# Patient Record
Sex: Male | Born: 1980 | Hispanic: No | Marital: Single | Smoking: Never smoker
Health system: Southern US, Community
[De-identification: ages and names within clinical notes are randomized; demographics above are authoritative.]

---

## 2015-01-20 ENCOUNTER — Encounter (HOSPITAL_BASED_OUTPATIENT_CLINIC_OR_DEPARTMENT_OTHER): Payer: Self-pay

## 2015-01-20 ENCOUNTER — Emergency Department (HOSPITAL_BASED_OUTPATIENT_CLINIC_OR_DEPARTMENT_OTHER)
Admission: EM | Admit: 2015-01-20 | Discharge: 2015-01-21 | Disposition: A | Discharge status: Home / Self Care | Payer: 59 | Attending: Emergency Medicine | Admitting: Emergency Medicine

## 2015-01-20 DIAGNOSIS — S61256A Open bite of right little finger without damage to nail, initial encounter: Secondary | ICD-10-CM | POA: Insufficient documentation

## 2015-01-20 DIAGNOSIS — Y999 Unspecified external cause status: Secondary | ICD-10-CM | POA: Insufficient documentation

## 2015-01-20 DIAGNOSIS — Y939 Activity, unspecified: Secondary | ICD-10-CM | POA: Diagnosis not present

## 2015-01-20 DIAGNOSIS — Y92009 Unspecified place in unspecified non-institutional (private) residence as the place of occurrence of the external cause: Secondary | ICD-10-CM | POA: Insufficient documentation

## 2015-01-20 DIAGNOSIS — Z23 Encounter for immunization: Secondary | ICD-10-CM | POA: Insufficient documentation

## 2015-01-20 DIAGNOSIS — W540XXA Bitten by dog, initial encounter: Secondary | ICD-10-CM | POA: Insufficient documentation

## 2015-01-20 NOTE — ED Notes (Signed)
Patient presents to ED after his dog bit his finger 2 hours ago. The patient reports his dog is up to date on her shots. The patient just wanted to verify that he did not need stitches.

## 2015-01-20 NOTE — ED Provider Notes (Addendum)
CSN: [id number]     Arrival date & time 01/20/15  2227 History  This chart was scribed for William Dolley, MD by William Conrad, ED Scribe. This patient was seen in room MHFT1/MHFT1 and the patient's care was started at 11:52 PM.    Chief Complaint  Patient presents with  . Animal Bite   Patient is a 35 y.o. male presenting with animal bite. The history is provided by the patient. No language interpreter was used.  Animal Bite Contact animal:  Dog Location:  Finger Finger injury location:  R little finger Time since incident:  2 hours Pain details:    Quality:  Aching   Severity:  Mild   Timing:  Constant   Progression:  Unchanged Incident location:  Home Notifications:  None Animal's rabies vaccination status:  Up to date Animal in possession: yes   Tetanus status:  Out of date Relieved by:  None tried Worsened by:  Nothing tried Ineffective treatments:  None tried Associated symptoms: no fever and no numbness    HPI Comments: William Conrad is a 34 y.o. male who presents to the Emergency Department complaining of a 8 week old puppy bite to the finger which occurred 2 hours ago.patient's own William Conrad Retriever pup is 23 months old and UTD on shots. He has come to the ED to enquire about stitches.  He is not UTD on his tetanus.  History reviewed. No pertinent past medical history. History reviewed. No pertinent past surgical history. History reviewed. No pertinent family history. History  Substance Use Topics  . Smoking status: Never Smoker   . Smokeless tobacco: Not on file  . Alcohol Use: Yes     Comment: social    Review of Systems  Constitutional: Negative for fever.  Skin: Positive for wound.  Neurological: Negative for numbness.  All other systems reviewed and are negative.  Allergies  Review of patient's allergies indicates no known allergies.  Home Medications   Prior to Admission medications   Not on File   BP 134/92 mmHg  Pulse 74  Temp(Src) 98.2 F (36.8  C) (Oral)  Resp 17  Ht  (1.727 m)  Wt 165 lb (74.844 kg)  BMI 25.09 kg/m2  SpO2 100% Physical Exam  Constitutional: He is oriented to person, place, and time. He appears well-developed and well-nourished. No distress.  HENT:  Head: Normocephalic and atraumatic.  Mouth/Throat: Oropharynx is clear and moist.  Eyes: Conjunctivae and EOM are normal. Pupils are equal, round, and reactive to light.  Neck: Normal range of motion. Neck supple. No tracheal deviation present.  Cardiovascular: Normal rate, regular rhythm and normal heart sounds.   Pulmonary/Chest: Effort normal and breath sounds normal. No respiratory distress. He has no wheezes. He has no rales.  Abdominal: Soft. Bowel sounds are normal. There is no tenderness. There is no rebound and no guarding.  Musculoskeletal: Normal range of motion.  Neurological: He is alert and oriented to person, place, and time.  Skin: Skin is warm and dry.  Superficial 0.9 cm laceration to palmar aspect of tuft pinky finger  Psychiatric: He has a normal mood and affect. His behavior is normal.  Nursing note and vitals reviewed.   ED Course  Procedures  DIAGNOSTIC STUDIES: Oxygen Saturation is 100% on RA, normal by my interpretation.    COORDINATION OF CARE: 11:57 PM - Discussed plans to clean wound and order antibiotics. Advised pt to quarantine the puppy for 10 days with the vet to ensure it does not  have rabies. Pt advised of plan for treatment and pt agrees.  Labs Review Labs Reviewed - No data to display  Imaging Review No results found.   EKG Interpretation None      MDM   Final diagnoses:  None   Animal can safely be quarantined and has a Administrator, [employer].  No indication for immunoglobulin or vaccine at this time.  Will not close due to risk of infection.  Cleansed and irrigated and bacitracin applied will prescribe augmentin and topical antibiotics and follow up with hand surgery.     I personally performed the services described  in this documentation, which was scribed in my presence. The recorded information has been reviewed and is accurate.    Cy Blamer, MD 01/21/15 0416  Brunetta Newingham, MD 01/21/15 [phone]

## 2015-01-20 NOTE — ED Notes (Signed)
MD at bedside. 

## 2015-01-21 ENCOUNTER — Encounter (HOSPITAL_BASED_OUTPATIENT_CLINIC_OR_DEPARTMENT_OTHER): Payer: Self-pay | Admitting: Emergency Medicine

## 2015-01-21 MED ORDER — TETANUS-DIPHTH-ACELL PERTUSSIS 5-2.5-18.5 LF-MCG/0.5 IM SUSP
0.5000 mL | Freq: Once | INTRAMUSCULAR | Status: AC
  Administered 2015-01-21: 0.5 mL via INTRAMUSCULAR

## 2015-01-21 MED ORDER — TETANUS-DIPHTH-ACELL PERTUSSIS 5-2.5-18.5 LF-MCG/0.5 IM SUSP
INTRAMUSCULAR | Status: AC
  Filled 2015-01-21: qty 0.5

## 2015-01-21 MED ORDER — AMOXICILLIN-POT CLAVULANATE 875-125 MG PO TABS
1.0000 | ORAL_TABLET | Freq: Once | ORAL | Status: AC
  Administered 2015-01-21: 1 via ORAL

## 2015-01-21 MED ORDER — AMOXICILLIN-POT CLAVULANATE 875-125 MG PO TABS: 1.0000 | ORAL_TABLET | Freq: Two times a day (BID) | ORAL | Status: DC

## 2015-01-21 MED ORDER — AMOXICILLIN-POT CLAVULANATE 875-125 MG PO TABS
ORAL_TABLET | ORAL | Status: AC
  Filled 2015-01-21: qty 1

## 2015-01-21 MED ORDER — MUPIROCIN CALCIUM 2 % EX CREA: 1.0000 "application " | TOPICAL_CREAM | Freq: Two times a day (BID) | CUTANEOUS | Status: DC

## 2015-01-21 NOTE — Discharge Instructions (Signed)

## 2015-10-15 ENCOUNTER — Emergency Department (HOSPITAL_BASED_OUTPATIENT_CLINIC_OR_DEPARTMENT_OTHER)
Admission: EM | Admit: 2015-10-15 | Discharge: 2015-10-16 | Disposition: A | Discharge status: Home / Self Care | Payer: 59 | Attending: Emergency Medicine | Admitting: Emergency Medicine

## 2015-10-15 ENCOUNTER — Emergency Department (HOSPITAL_BASED_OUTPATIENT_CLINIC_OR_DEPARTMENT_OTHER): Payer: 59

## 2015-10-15 ENCOUNTER — Encounter (HOSPITAL_BASED_OUTPATIENT_CLINIC_OR_DEPARTMENT_OTHER): Payer: Self-pay | Admitting: Emergency Medicine

## 2015-10-15 DIAGNOSIS — R0602 Shortness of breath: Secondary | ICD-10-CM | POA: Insufficient documentation

## 2015-10-15 DIAGNOSIS — Y92410 Unspecified street and highway as the place of occurrence of the external cause: Secondary | ICD-10-CM | POA: Diagnosis not present

## 2015-10-15 DIAGNOSIS — Y9389 Activity, other specified: Secondary | ICD-10-CM | POA: Insufficient documentation

## 2015-10-15 DIAGNOSIS — S20411A Abrasion of right back wall of thorax, initial encounter: Secondary | ICD-10-CM | POA: Diagnosis not present

## 2015-10-15 DIAGNOSIS — S30811A Abrasion of abdominal wall, initial encounter: Secondary | ICD-10-CM | POA: Diagnosis not present

## 2015-10-15 DIAGNOSIS — S0181XA Laceration without foreign body of other part of head, initial encounter: Secondary | ICD-10-CM | POA: Insufficient documentation

## 2015-10-15 DIAGNOSIS — S0990XA Unspecified injury of head, initial encounter: Secondary | ICD-10-CM | POA: Diagnosis present

## 2015-10-15 DIAGNOSIS — Z792 Long term (current) use of antibiotics: Secondary | ICD-10-CM | POA: Diagnosis not present

## 2015-10-15 DIAGNOSIS — S20412A Abrasion of left back wall of thorax, initial encounter: Secondary | ICD-10-CM | POA: Diagnosis not present

## 2015-10-15 DIAGNOSIS — Y998 Other external cause status: Secondary | ICD-10-CM | POA: Diagnosis not present

## 2015-10-15 DIAGNOSIS — S40212A Abrasion of left shoulder, initial encounter: Secondary | ICD-10-CM | POA: Diagnosis not present

## 2015-10-15 DIAGNOSIS — S199XXA Unspecified injury of neck, initial encounter: Secondary | ICD-10-CM | POA: Insufficient documentation

## 2015-10-15 LAB — BASIC METABOLIC PANEL
Anion gap: 6 (ref 5–15)
BUN: 14 mg/dL (ref 6–20)
CALCIUM: 9.1 mg/dL (ref 8.9–10.3)
CO2: 27 mmol/L (ref 22–32)
CREATININE: 0.83 mg/dL (ref 0.61–1.24)
Chloride: 104 mmol/L (ref 101–111)
GFR calc non Af Amer: >60 mL/min (ref >60)
Glucose, Bld: 137 mg/dL — ABNORMAL HIGH (ref 65–99)
Potassium: 4 mmol/L (ref 3.5–5.1)
Sodium: 137 mmol/L (ref 135–145)

## 2015-10-15 LAB — CBC WITH DIFFERENTIAL/PLATELET
Basophils Absolute: 0 10*3/uL (ref 0.0–0.1)
Basophils Relative: 0 %
Eosinophils Absolute: 0.2 10*3/uL (ref 0.0–0.7)
Eosinophils Relative: 2 %
HCT: 39.9 % (ref 39.0–52.0)
HEMOGLOBIN: 13.4 g/dL (ref 13.0–17.0)
LYMPHS ABS: 0.9 10*3/uL (ref 0.7–4.0)
LYMPHS PCT: 8 %
MCH: 27 pg (ref 26.0–34.0)
MCHC: 33.6 g/dL (ref 30.0–36.0)
MCV: 80.4 fL (ref 78.0–100.0)
MONO ABS: 0.6 10*3/uL (ref 0.1–1.0)
MONOS PCT: 5 %
NEUTROS ABS: 10.2 10*3/uL — AB (ref 1.7–7.7)
NEUTROS PCT: 85 %
Platelets: 246 10*3/uL (ref 150–400)
RBC: 4.96 MIL/uL (ref 4.22–5.81)
RDW: 12.8 % (ref 11.5–15.5)
WBC: 12.1 10*3/uL — ABNORMAL HIGH (ref 4.0–10.5)

## 2015-10-15 MED ORDER — IOHEXOL 300 MG/ML  SOLN: 100.0000 mL | Freq: Once | INTRAMUSCULAR | Status: DC | PRN

## 2015-10-15 MED ORDER — IOPAMIDOL (ISOVUE-300) INJECTION 61%
100.0000 mL | Freq: Once | INTRAVENOUS | Status: AC | PRN
  Administered 2015-10-15: 100 mL via INTRAVENOUS

## 2015-10-15 MED ORDER — ONDANSETRON HCL 4 MG/2ML IJ SOLN
4.0000 mg | Freq: Once | INTRAMUSCULAR | Status: AC
  Administered 2015-10-15: 4 mg via INTRAVENOUS
  Filled 2015-10-15: qty 2

## 2015-10-15 MED ORDER — MORPHINE SULFATE (PF) 4 MG/ML IV SOLN
4.0000 mg | Freq: Once | INTRAVENOUS | Status: AC
  Administered 2015-10-15: 4 mg via INTRAVENOUS
  Filled 2015-10-15: qty 1

## 2015-10-15 NOTE — ED Notes (Signed)
MD at bedside. 

## 2015-10-15 NOTE — ED Notes (Addendum)
Pt is alert and neuro intact, but is slightly nauseated and vomited once PTA.
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
## 2018-10-16 ENCOUNTER — Encounter (HOSPITAL_BASED_OUTPATIENT_CLINIC_OR_DEPARTMENT_OTHER): Payer: Self-pay | Admitting: *Deleted

## 2018-10-16 ENCOUNTER — Other Ambulatory Visit: Payer: Self-pay

## 2018-10-16 ENCOUNTER — Emergency Department (HOSPITAL_BASED_OUTPATIENT_CLINIC_OR_DEPARTMENT_OTHER)
Admission: EM | Admit: 2018-10-16 | Discharge: 2018-10-16 | Disposition: A | Discharge status: Home / Self Care | Payer: 59 | Attending: Emergency Medicine | Admitting: Emergency Medicine

## 2018-10-16 DIAGNOSIS — Y92009 Unspecified place in unspecified non-institutional (private) residence as the place of occurrence of the external cause: Secondary | ICD-10-CM | POA: Diagnosis not present

## 2018-10-16 DIAGNOSIS — S51852A Open bite of left forearm, initial encounter: Secondary | ICD-10-CM | POA: Insufficient documentation

## 2018-10-16 DIAGNOSIS — Y9389 Activity, other specified: Secondary | ICD-10-CM | POA: Diagnosis not present

## 2018-10-16 DIAGNOSIS — Z23 Encounter for immunization: Secondary | ICD-10-CM | POA: Insufficient documentation

## 2018-10-16 DIAGNOSIS — Y999 Unspecified external cause status: Secondary | ICD-10-CM | POA: Insufficient documentation

## 2018-10-16 DIAGNOSIS — W540XXA Bitten by dog, initial encounter: Secondary | ICD-10-CM | POA: Diagnosis not present

## 2018-10-16 MED ORDER — TETANUS-DIPHTH-ACELL PERTUSSIS 5-2.5-18.5 LF-MCG/0.5 IM SUSP
0.5000 mL | Freq: Once | INTRAMUSCULAR | Status: AC
  Administered 2018-10-16: 01:00:00 0.5 mL via INTRAMUSCULAR
  Filled 2018-10-16: qty 0.5

## 2018-10-16 MED ORDER — BACITRACIN ZINC 500 UNIT/GM EX OINT
TOPICAL_OINTMENT | Freq: Once | CUTANEOUS | Status: AC
  Administered 2018-10-16: 1 via TOPICAL
  Filled 2018-10-16: qty 28.35

## 2018-10-16 MED ORDER — AMOXICILLIN-POT CLAVULANATE 875-125 MG PO TABS: 1.0000 | ORAL_TABLET | Freq: Two times a day (BID) | ORAL | 0 refills | Status: AC

## 2018-10-16 MED ORDER — AMOXICILLIN-POT CLAVULANATE 875-125 MG PO TABS
1.0000 | ORAL_TABLET | Freq: Once | ORAL | Status: AC
  Administered 2018-10-16: 1 via ORAL
  Filled 2018-10-16: qty 1

## 2018-10-16 NOTE — ED Notes (Signed)
Pt verbalizes understanding of d/c instructions and denies any further needs at this time. 

## 2018-10-16 NOTE — ED Provider Notes (Signed)
TIME SEEN: 1:17 AM  CHIEF COMPLAINT: Dog bite left forearm  HPI: Patient is a 38 year old male with no significant past medical history who presents to the emergency department with 3 small puncture wounds to his left forearm after he was bit by his dog.  States that he was trying to catch the dog as it was falling off the bed and thinks that it either scratched him or bit him.  The marks on his arm look more like bite marks.  He states the dog is up-to-date on rabies vaccination.  He states his tetanus vaccination was over 10 years ago.  He was concerned and came here for antibiotics.  This occurred just prior to arrival.  No other injury.  ROS: See HPI Constitutional: no fever  Eyes: no drainage  ENT: no runny nose   Cardiovascular:  no chest pain  Resp: no SOB  GI: no vomiting GU: no dysuria Integumentary: no rash  Allergy: no hives  Musculoskeletal: no leg swelling  Neurological: no slurred speech ROS otherwise negative  PAST MEDICAL HISTORY/PAST SURGICAL HISTORY:  History reviewed. No pertinent past medical history.  MEDICATIONS:  Prior to Admission medications   Medication Sig Start Date End Date Taking? Authorizing Provider  amoxicillin-clavulanate (AUGMENTIN) 875-125 MG tablet Take 1 tablet by mouth every 12 (twelve) hours. 10/16/18   , Layla MawKristen N, DO    ALLERGIES:  No Known Allergies  SOCIAL HISTORY:  Social History   Tobacco Use  . Smoking status: Never Smoker  . Smokeless tobacco: Never Used  Substance Use Topics  . Alcohol use: Yes    Comment: social    FAMILY HISTORY: History reviewed. No pertinent family history.  EXAM: BP 133/90 (BP Location: Right Arm)   Pulse 89   Temp 98.5 F (36.9 C)   Resp 16   Ht 5\' 8"  (1.727 m)   Wt 81.6 kg   SpO2 99%   BMI 27.37 kg/m  CONSTITUTIONAL: Alert and responds appropriately to questions. Well-appearing; well-nourished HEAD: Normocephalic, atraumatic EYES: Conjunctivae clear, pupils appear equal ENT: normal  nose; moist mucous membranes NECK: Normal range of motion CARD: Regular rate and rhythm RESP: Normal chest excursion without splinting or tachypnea; no hypoxia or respiratory distress, speaking full sentences ABD/GI: non-distended EXT: Normal ROM in all joints, no major deformities noted; 2+ left radial pulse.  compartments of the left arm are soft. SKIN: Normal color for age and race, no rashes on exposed skin, patient has 3 small puncture wounds to the left dorsal forearm without surrounding redness, warmth, drainage.  No active bleeding.  No fluctuance or crepitus.   NEURO: Moves all extremities equally, normal speech, no facial asymmetry noted PSYCH: The patient's mood and manner are appropriate. Grooming and personal hygiene are appropriate.  MEDICAL DECISION MAKING: Patient here with what appears to be a dog bite rather than a dog scratch to the left forearm.  No signs of foreign bodies, no lacerations that need repair.  No sign of superimposed infection currently.  Compartments are soft.  No bony deformity or abnormality noted.  He is a very superficial in nature.  We will clean the wound, apply bacitracin and nonadherent dressing.  Will discharge with Augmentin, first dose given in the emergency department.  Tetanus vaccination has been updated.  He does not need rabies vaccinations or rabies immunoglobulin at this time as his dog is currently up-to-date on its rabies vaccination.  Discussed wound care instructions and return precautions with patient.  He verbalized understanding.  At  this time, I do not feel there is any life-threatening condition present. I have reviewed and discussed all results (EKG, imaging, lab, urine as appropriate) and exam findings with patient/family. I have reviewed nursing notes and appropriate previous records.  I feel the patient is safe to be discharged home without further emergent workup and can continue workup as an outpatient as needed. Discussed usual and  customary return precautions. Patient/family verbalize understanding and are comfortable with this plan.  Outpatient follow-up has been provided as needed. All questions have been answered.      , Layla Maw, DO 10/16/18 [id number]

## 2018-10-16 NOTE — Discharge Instructions (Signed)
You may clean wounds once a day with warm soap and water and apply over-the-counter triple antibiotic ointment/Neosporin daily.  Please take your antibiotics as prescribed.  You have been updated with a tetanus vaccination today.  You do not need rabies vaccination given that your dog is up-to-date on its rabies vaccination.

## 2018-10-16 NOTE — ED Triage Notes (Signed)
Pt c/o left arm dog scratch x 1 day
